# Patient Record
Sex: Female | Born: 1980 | Race: White | Hispanic: No | Marital: Single | Smoking: Former smoker
Health system: Southern US, Community
[De-identification: ages and names within clinical notes are randomized; demographics above are authoritative.]

## PROBLEM LIST (undated history)

## (undated) DIAGNOSIS — G40909 Epilepsy, unspecified, not intractable, without status epilepticus: Secondary | ICD-10-CM

## (undated) HISTORY — PX: ANKLE SURGERY: SHX546

## (undated) HISTORY — PX: CHOLECYSTECTOMY: SHX55

---

## 2003-11-08 ENCOUNTER — Observation Stay: Payer: Self-pay

## 2003-11-09 ENCOUNTER — Ambulatory Visit: Payer: Self-pay

## 2003-11-16 ENCOUNTER — Emergency Department: Payer: Self-pay | Admitting: Internal Medicine

## 2003-12-15 ENCOUNTER — Observation Stay: Payer: Self-pay | Admitting: Obstetrics and Gynecology

## 2003-12-20 ENCOUNTER — Inpatient Hospital Stay: Payer: Self-pay

## 2007-09-21 ENCOUNTER — Emergency Department: Payer: Self-pay | Admitting: Emergency Medicine

## 2008-03-13 ENCOUNTER — Emergency Department: Payer: Self-pay | Admitting: Emergency Medicine

## 2010-01-12 ENCOUNTER — Emergency Department: Payer: Self-pay | Admitting: Emergency Medicine

## 2011-08-30 ENCOUNTER — Ambulatory Visit: Payer: Self-pay | Admitting: Medical

## 2011-10-15 ENCOUNTER — Ambulatory Visit: Payer: Self-pay | Admitting: Medical

## 2011-10-15 LAB — RAPID STREP-A WITH REFLX: Micro Text Report: NEGATIVE

## 2011-10-17 LAB — BETA STREP CULTURE(ARMC)

## 2011-12-20 ENCOUNTER — Ambulatory Visit: Payer: Self-pay | Admitting: Family Medicine

## 2014-03-30 ENCOUNTER — Emergency Department: Payer: Self-pay | Admitting: Emergency Medicine

## 2014-05-15 ENCOUNTER — Encounter: Payer: Self-pay | Admitting: Emergency Medicine

## 2014-05-15 ENCOUNTER — Emergency Department
Admission: EM | Admit: 2014-05-15 | Discharge: 2014-05-15 | Disposition: A | Discharge status: Home / Self Care | Payer: BC Managed Care – PPO | Attending: Emergency Medicine | Admitting: Emergency Medicine

## 2014-05-15 ENCOUNTER — Emergency Department: Payer: BC Managed Care – PPO

## 2014-05-15 DIAGNOSIS — T148 Other injury of unspecified body region: Secondary | ICD-10-CM | POA: Insufficient documentation

## 2014-05-15 DIAGNOSIS — Y92410 Unspecified street and highway as the place of occurrence of the external cause: Secondary | ICD-10-CM | POA: Insufficient documentation

## 2014-05-15 DIAGNOSIS — Y9389 Activity, other specified: Secondary | ICD-10-CM | POA: Diagnosis not present

## 2014-05-15 DIAGNOSIS — S8001XA Contusion of right knee, initial encounter: Secondary | ICD-10-CM | POA: Insufficient documentation

## 2014-05-15 DIAGNOSIS — Y998 Other external cause status: Secondary | ICD-10-CM | POA: Insufficient documentation

## 2014-05-15 DIAGNOSIS — S8991XA Unspecified injury of right lower leg, initial encounter: Secondary | ICD-10-CM | POA: Diagnosis present

## 2014-05-15 DIAGNOSIS — S8002XA Contusion of left knee, initial encounter: Secondary | ICD-10-CM | POA: Insufficient documentation

## 2014-05-15 DIAGNOSIS — S20219A Contusion of unspecified front wall of thorax, initial encounter: Secondary | ICD-10-CM | POA: Insufficient documentation

## 2014-05-15 DIAGNOSIS — T148XXA Other injury of unspecified body region, initial encounter: Secondary | ICD-10-CM

## 2014-05-15 HISTORY — DX: Epilepsy, unspecified, not intractable, without status epilepticus: G40.909

## 2014-05-15 MED ORDER — TRAMADOL HCL 50 MG PO TABS: 50.0000 mg | ORAL_TABLET | Freq: Four times a day (QID) | ORAL | Status: AC | PRN

## 2014-05-15 MED ORDER — KETOROLAC TROMETHAMINE 10 MG PO TABS
10.0000 mg | ORAL_TABLET | Freq: Once | ORAL | Status: AC
  Administered 2014-05-15: 10 mg via ORAL

## 2014-05-15 MED ORDER — IBUPROFEN 800 MG PO TABS: 800.0000 mg | ORAL_TABLET | Freq: Three times a day (TID) | ORAL | Status: DC | PRN

## 2014-05-15 MED ORDER — KETOROLAC TROMETHAMINE 10 MG PO TABS
ORAL_TABLET | ORAL | Status: AC
  Filled 2014-05-15: qty 1
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
## 2014-05-15 NOTE — Discharge Instructions (Signed)
Contusion °A contusion is a deep bruise. Contusions are the result of an injury that caused bleeding under the skin. The contusion may turn blue, purple, or yellow. Minor injuries will give you a painless contusion, but more severe contusions may stay painful and swollen for a few weeks.  °CAUSES  °A contusion is usually caused by a blow, trauma, or direct force to an area of the body. °SYMPTOMS  °· Swelling and redness of the injured area. °· Bruising of the injured area. °· Tenderness and soreness of the injured area. °· Pain. °DIAGNOSIS  °The diagnosis can be made by taking a history and physical exam. An X-ray, CT scan, or MRI may be needed to determine if there were any associated injuries, such as fractures. °TREATMENT  °Specific treatment will depend on what area of the body was injured. In general, the best treatment for a contusion is resting, icing, elevating, and applying cold compresses to the injured area. Over-the-counter medicines may also be recommended for pain control. Ask your caregiver what the best treatment is for your contusion. °HOME CARE INSTRUCTIONS  °· Put ice on the injured area. °· Put ice in a plastic bag. °· Place a towel between your skin and the bag. °· Leave the ice on for 15-20 minutes, 3-4 times a day, or as directed by your health care provider. °· Only take over-the-counter or prescription medicines for pain, discomfort, or fever as directed by your caregiver. Your caregiver may recommend avoiding anti-inflammatory medicines (aspirin, ibuprofen, and naproxen) for 48 hours because these medicines may increase bruising. °· Rest the injured area. °· If possible, elevate the injured area to reduce swelling. °SEEK IMMEDIATE MEDICAL CARE IF:  °· You have increased bruising or swelling. °· You have pain that is getting worse. °· Your swelling or pain is not relieved with medicines. °MAKE SURE YOU:  °· Understand these instructions. °· Will watch your condition. °· Will get help right  away if you are not doing well or get worse. °Document Released: 10/08/2004 Document Revised: 01/03/2013 Document Reviewed: 11/03/2010 °[employer]® Patient Information ©2015 [employer]. This information is not intended to replace advice given to you by your health care provider. Make sure you discuss any questions you have with your health care provider. ° °Abrasion °An abrasion is a cut or scrape of the skin. Abrasions do not extend through all layers of the skin and most heal within 10 days. It is important to care for your abrasion properly to prevent infection. °CAUSES  °Most abrasions are caused by falling on, or gliding across, the ground or other surface. When your skin rubs on something, the outer and inner layer of skin rubs off, causing an abrasion. °DIAGNOSIS  °Your caregiver will be able to diagnose an abrasion during a physical exam.  °TREATMENT  °Your treatment depends on how large and deep the abrasion is. Generally, your abrasion will be cleaned with water and a mild soap to remove any dirt or debris. An antibiotic ointment may be put over the abrasion to prevent an infection. A bandage (dressing) may be wrapped around the abrasion to keep it from getting dirty.  °You may need a tetanus shot if: °· You cannot remember when you had your last tetanus shot. °· You have never had a tetanus shot. °· The injury broke your skin. °If you get a tetanus shot, your arm may swell, get red, and feel warm to the touch. This is common and not a problem. If you need a   tetanus shot and you choose not to have one, there is a rare chance of getting tetanus. Sickness from tetanus can be serious.  °HOME CARE INSTRUCTIONS  °· If a dressing was applied, change it at least once a day or as directed by your caregiver. If the bandage sticks, soak it off with warm water.   °· Wash the area with water and a mild soap to remove all the ointment 2 times a day. Rinse off the soap and pat the area dry with a clean towel.    °· Reapply any ointment as directed by your caregiver. This will help prevent infection and keep the bandage from sticking. Use gauze over the wound and under the dressing to help keep the bandage from sticking.   °· Change your dressing right away if it becomes wet or dirty.   °· Only take over-the-counter or prescription medicines for pain, discomfort, or fever as directed by your caregiver.   °· Follow up with your caregiver within 24-48 hours for a wound check, or as directed. If you were not given a wound-check appointment, look closely at your abrasion for redness, swelling, or pus. These are signs of infection. °SEEK IMMEDIATE MEDICAL CARE IF:  °· You have increasing pain in the wound.   °· You have redness, swelling, or tenderness around the wound.   °· You have pus coming from the wound.   °· You have a fever or persistent symptoms for more than 2-3 days. °· You have a fever and your symptoms suddenly get worse. °· You have a bad smell coming from the wound or dressing.   °MAKE SURE YOU:  °· Understand these instructions. °· Will watch your condition. °· Will get help right away if you are not doing well or get worse. °Document Released: 10/08/2004 Document Revised: 12/16/2011 Document Reviewed: 12/02/2010 °ExitCare® Patient Information ©2015 [employer]. This information is not intended to replace advice given to you by your health care provider. Make sure you discuss any questions you have with your health care provider. ° °
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
## 2015-02-23 ENCOUNTER — Ambulatory Visit
Admission: EM | Admit: 2015-02-23 | Discharge: 2015-02-23 | Disposition: A | Discharge status: Home / Self Care | Payer: BC Managed Care – PPO

## 2015-02-26 ENCOUNTER — Ambulatory Visit
Admission: EM | Admit: 2015-02-26 | Discharge: 2015-02-26 | Disposition: A | Discharge status: Home / Self Care | Payer: BC Managed Care – PPO | Attending: Family Medicine | Admitting: Family Medicine

## 2015-02-26 DIAGNOSIS — J0100 Acute maxillary sinusitis, unspecified: Secondary | ICD-10-CM | POA: Diagnosis not present

## 2015-02-26 DIAGNOSIS — R05 Cough: Secondary | ICD-10-CM

## 2015-02-26 DIAGNOSIS — R059 Cough, unspecified: Secondary | ICD-10-CM

## 2015-02-26 LAB — RAPID INFLUENZA A&B ANTIGENS: Influenza B (ARMC): NOT DETECTED

## 2015-02-26 LAB — RAPID INFLUENZA A&B ANTIGENS (ARMC ONLY): INFLUENZA A (ARMC): NOT DETECTED

## 2015-02-26 LAB — RAPID STREP SCREEN (MED CTR MEBANE ONLY): Streptococcus, Group A Screen (Direct): NEGATIVE

## 2015-02-26 MED ORDER — GUAIFENESIN-CODEINE 100-10 MG/5ML PO SOLN: 10.0000 mL | Freq: Four times a day (QID) | ORAL | Status: DC | PRN

## 2015-02-26 MED ORDER — AMOXICILLIN 875 MG PO TABS: 875.0000 mg | ORAL_TABLET | Freq: Two times a day (BID) | ORAL | Status: DC

## 2015-02-26 NOTE — ED Provider Notes (Signed)
CSN: [id number]     Arrival date & time 02/26/15  4098 History   First MD Initiated Contact with Patient 02/26/15 1037     Chief Complaint  Patient presents with  . URI   (Consider location/radiation/quality/duration/timing/severity/associated sxs/prior Treatment) Patient is a 35 y.o. female presenting with URI. The history is provided by the patient.  URI Presenting symptoms: congestion, cough, facial pain, fatigue, fever and sore throat   Severity:  Moderate Onset quality:  Sudden Duration:  3 weeks Timing:  Constant Progression:  Worsening Chronicity:  New Relieved by:  Nothing Ineffective treatments:  OTC medications Associated symptoms: headaches and sinus pain   Associated symptoms: no wheezing   Risk factors: sick contacts   Risk factors: not elderly, no chronic cardiac disease, no chronic kidney disease, no chronic respiratory disease, no diabetes mellitus, no immunosuppression and no recent travel     Past Medical History  Diagnosis Date  . EP (epilepsy) (HCC)    History reviewed. No pertinent past surgical history. Family History  Problem Relation Age of Onset  . Cancer Mother   . Heart attack Father    Social History  Substance Use Topics  . Smoking status: Never Smoker   . Smokeless tobacco: None  . Alcohol Use: Yes   OB History    No data available     Review of Systems  Constitutional: Positive for fever and fatigue.  HENT: Positive for congestion and sore throat.   Respiratory: Positive for cough. Negative for wheezing.   Neurological: Positive for headaches.    Allergies  Review of patient's allergies indicates no known allergies.  Home Medications   Prior to Admission medications   Medication Sig Start Date End Date Taking? Authorizing Provider  escitalopram (LEXAPRO) 20 MG tablet Take 20 mg by mouth daily.   Yes Historical Provider, MD  lamoTRIgine (LAMICTAL) 200 MG tablet Take 200 mg by mouth 2 (two) times daily.   Yes Historical  Provider, MD  levETIRAcetam (KEPPRA) 1000 MG tablet Take 1,500 mg by mouth 2 (two) times daily.   Yes Historical Provider, MD  topiramate (TOPAMAX) 100 MG tablet Take 100 mg by mouth 2 (two) times daily.   Yes Historical Provider, MD  amoxicillin (AMOXIL) 875 MG tablet Take 1 tablet (875 mg total) by mouth 2 (two) times daily. 02/26/15   Payton Mccallum, MD  guaiFENesin-codeine 100-10 MG/5ML syrup Take 10 mLs by mouth every 6 (six) hours as needed for cough. 02/26/15   Payton Mccallum, MD  ibuprofen (ADVIL,MOTRIN) 800 MG tablet Take 1 tablet (800 mg total) by mouth every 8 (eight) hours as needed. 05/15/14   III Kristine Garbe Ruffian, PA-C  traMADol (ULTRAM) 50 MG tablet Take 1 tablet (50 mg total) by mouth every 6 (six) hours as needed. 05/15/14 05/15/15  III Rosalyn Gess, PA-C   Meds Ordered and Administered this Visit  Medications - No data to display  BP 106/64 mmHg  Pulse 80  Temp(Src) 99 F (37.2 C) (Oral)  Resp 18  Ht  (1.702 m)  Wt 208 lb (94.348 kg)  BMI 32.57 kg/m2  SpO2 100% No data found.   Physical Exam  Constitutional: She appears well-developed and well-nourished. No distress.  HENT:  Head: Normocephalic and atraumatic.  Right Ear: Tympanic membrane, external ear and ear canal normal.  Left Ear: Tympanic membrane, external ear and ear canal normal.  Nose: Mucosal edema and rhinorrhea present. No nose lacerations, sinus tenderness, nasal deformity, septal deviation or nasal septal hematoma. No  epistaxis.  No foreign bodies. Right sinus exhibits maxillary sinus tenderness and frontal sinus tenderness. Left sinus exhibits maxillary sinus tenderness and frontal sinus tenderness.  Mouth/Throat: Uvula is midline, oropharynx is clear and moist and mucous membranes are normal. No oropharyngeal exudate.  Eyes: Conjunctivae and EOM are normal. Pupils are equal, round, and reactive to light. Right eye exhibits no discharge. Left eye exhibits no discharge. No scleral icterus.  Neck:  Normal range of motion. Neck supple. No thyromegaly present.  Cardiovascular: Normal rate, regular rhythm and normal heart sounds.   Pulmonary/Chest: Effort normal and breath sounds normal. No respiratory distress. She has no wheezes. She has no rales.  Lymphadenopathy:    She has no cervical adenopathy.  Skin: She is not diaphoretic.  Nursing note and vitals reviewed.   ED Course  Procedures (including critical care time)  Labs Review Labs Reviewed  RAPID INFLUENZA A&B ANTIGENS ([facility] ONLY)  RAPID STREP SCREEN (NOT AT [facility])  CULTURE, GROUP A STREP [facility])    Imaging Review No results found.   Visual Acuity Review  Right Eye Distance:   Left Eye Distance:   Bilateral Distance:    Right Eye Near:   Left Eye Near:    Bilateral Near:         MDM   1. Acute maxillary sinusitis, recurrence not specified   2. Cough    Discharge Medication List as of 02/26/2015 10:49 AM    START taking these medications   Details  amoxicillin (AMOXIL) 875 MG tablet Take 1 tablet (875 mg total) by mouth 2 (two) times daily., Starting 02/26/2015, Until Discontinued, Normal    guaiFENesin-codeine 100-10 MG/5ML syrup Take 10 mLs by mouth every 6 (six) hours as needed for cough., Starting 02/26/2015, Until Discontinued, Print       1. diagnosis reviewed with patient 2. rx as per orders above; reviewed possible side effects, interactions, risks and benefits  3. Recommend supportive treatment with otc analgesics prn 4. Follow-up prn if symptoms worsen or don't improve    Payton Mccallum, MD 02/26/15 1122

## 2015-02-26 NOTE — ED Notes (Signed)
Patient c/o left ear pain, cough, sinus pressure, and sore throat which started two Sundays ago.  Denies fever/c/n/v or chest pain.

## 2015-02-28 LAB — CULTURE, GROUP A STREP (THRC)

## 2015-11-11 ENCOUNTER — Other Ambulatory Visit: Payer: Self-pay | Admitting: Obstetrics and Gynecology

## 2015-11-11 DIAGNOSIS — Z803 Family history of malignant neoplasm of breast: Secondary | ICD-10-CM

## 2015-11-11 DIAGNOSIS — Z9189 Other specified personal risk factors, not elsewhere classified: Secondary | ICD-10-CM

## 2015-11-11 DIAGNOSIS — Z1239 Encounter for other screening for malignant neoplasm of breast: Secondary | ICD-10-CM

## 2015-11-18 ENCOUNTER — Ambulatory Visit: Admission: RE | Admit: 2015-11-18 | Payer: No Typology Code available for payment source | Source: Ambulatory Visit

## 2015-11-20 ENCOUNTER — Ambulatory Visit: Payer: No Typology Code available for payment source

## 2015-11-26 ENCOUNTER — Ambulatory Visit
Admission: RE | Admit: 2015-11-26 | Discharge: 2015-11-26 | Disposition: A | Discharge status: Home / Self Care | Payer: BLUE CROSS/BLUE SHIELD | Source: Ambulatory Visit | Attending: Obstetrics and Gynecology | Admitting: Obstetrics and Gynecology

## 2015-11-26 ENCOUNTER — Encounter (INDEPENDENT_AMBULATORY_CARE_PROVIDER_SITE_OTHER): Payer: Self-pay

## 2015-11-26 ENCOUNTER — Other Ambulatory Visit: Payer: Self-pay | Admitting: Obstetrics and Gynecology

## 2015-11-26 DIAGNOSIS — Z9189 Other specified personal risk factors, not elsewhere classified: Secondary | ICD-10-CM

## 2015-11-26 DIAGNOSIS — Z1231 Encounter for screening mammogram for malignant neoplasm of breast: Secondary | ICD-10-CM | POA: Insufficient documentation

## 2015-11-26 DIAGNOSIS — Z803 Family history of malignant neoplasm of breast: Secondary | ICD-10-CM

## 2015-11-26 DIAGNOSIS — Z1239 Encounter for other screening for malignant neoplasm of breast: Secondary | ICD-10-CM

## 2015-11-29 ENCOUNTER — Other Ambulatory Visit: Payer: Self-pay | Admitting: Obstetrics and Gynecology

## 2015-11-29 DIAGNOSIS — R921 Mammographic calcification found on diagnostic imaging of breast: Secondary | ICD-10-CM

## 2015-12-16 ENCOUNTER — Ambulatory Visit: Payer: No Typology Code available for payment source

## 2015-12-18 ENCOUNTER — Other Ambulatory Visit: Payer: Self-pay | Admitting: Obstetrics and Gynecology

## 2015-12-18 ENCOUNTER — Ambulatory Visit
Admission: RE | Admit: 2015-12-18 | Discharge: 2015-12-18 | Disposition: A | Discharge status: Home / Self Care | Payer: BLUE CROSS/BLUE SHIELD | Source: Ambulatory Visit | Attending: Obstetrics and Gynecology | Admitting: Obstetrics and Gynecology

## 2015-12-18 DIAGNOSIS — R921 Mammographic calcification found on diagnostic imaging of breast: Secondary | ICD-10-CM | POA: Insufficient documentation

## 2015-12-25 ENCOUNTER — Emergency Department
Admission: EM | Admit: 2015-12-25 | Discharge: 2015-12-25 | Disposition: A | Discharge status: Home / Self Care | Payer: BLUE CROSS/BLUE SHIELD | Attending: Emergency Medicine | Admitting: Emergency Medicine

## 2015-12-25 DIAGNOSIS — Z79899 Other long term (current) drug therapy: Secondary | ICD-10-CM | POA: Diagnosis not present

## 2015-12-25 DIAGNOSIS — R1013 Epigastric pain: Secondary | ICD-10-CM | POA: Insufficient documentation

## 2015-12-25 LAB — URINALYSIS, COMPLETE (UACMP) WITH MICROSCOPIC
BILIRUBIN URINE: NEGATIVE
GLUCOSE, UA: NEGATIVE mg/dL
Hgb urine dipstick: NEGATIVE
KETONES UR: 5 mg/dL — AB
Nitrite: NEGATIVE
PH: 5 (ref 5.0–8.0)
PROTEIN: NEGATIVE mg/dL
Specific Gravity, Urine: 1.017 (ref 1.005–1.030)

## 2015-12-25 LAB — CBC
HCT: 39 % (ref 35.0–47.0)
HEMOGLOBIN: 13.4 g/dL (ref 12.0–16.0)
MCH: 30.6 pg (ref 26.0–34.0)
MCHC: 34.5 g/dL (ref 32.0–36.0)
MCV: 88.8 fL (ref 80.0–100.0)
PLATELETS: 295 10*3/uL (ref 150–440)
RBC: 4.39 MIL/uL (ref 3.80–5.20)
RDW: 13.9 % (ref 11.5–14.5)
WBC: 15.7 10*3/uL — AB (ref 3.6–11.0)

## 2015-12-25 LAB — COMPREHENSIVE METABOLIC PANEL
ALT: 23 U/L (ref 14–54)
ANION GAP: 7 (ref 5–15)
AST: 28 U/L (ref 15–41)
Albumin: 4.2 g/dL (ref 3.5–5.0)
Alkaline Phosphatase: 116 U/L (ref 38–126)
BUN: 11 mg/dL (ref 6–20)
CHLORIDE: 108 mmol/L (ref 101–111)
CO2: 25 mmol/L (ref 22–32)
CREATININE: 0.94 mg/dL (ref 0.44–1.00)
Calcium: 9.4 mg/dL (ref 8.9–10.3)
Glucose, Bld: 126 mg/dL — ABNORMAL HIGH (ref 65–99)
Potassium: 3.8 mmol/L (ref 3.5–5.1)
SODIUM: 140 mmol/L (ref 135–145)
Total Bilirubin: 0.8 mg/dL (ref 0.3–1.2)
Total Protein: 8.4 g/dL — ABNORMAL HIGH (ref 6.5–8.1)

## 2015-12-25 LAB — LIPASE, BLOOD: LIPASE: 45 U/L (ref 11–51)

## 2015-12-25 MED ORDER — FAMOTIDINE 20 MG PO TABS: 20.0000 mg | ORAL_TABLET | Freq: Two times a day (BID) | ORAL | 0 refills | Status: DC

## 2015-12-25 MED ORDER — ALUMINUM-MAGNESIUM-SIMETHICONE 200-200-20 MG/5ML PO SUSP: 30.0000 mL | Freq: Three times a day (TID) | ORAL | 0 refills | Status: DC

## 2015-12-25 MED ORDER — GI COCKTAIL ~~LOC~~
30.0000 mL | ORAL | Status: AC
  Administered 2015-12-25: 30 mL via ORAL
  Filled 2015-12-25: qty 30

## 2015-12-25 MED ORDER — FAMOTIDINE 20 MG PO TABS
40.0000 mg | ORAL_TABLET | Freq: Once | ORAL | Status: AC
  Administered 2015-12-25: 40 mg via ORAL
  Filled 2015-12-25: qty 2

## 2015-12-25 NOTE — ED Triage Notes (Signed)
Pt reports generalized abd pain since 1130 this morning, denies N/V/D

## 2015-12-25 NOTE — ED Notes (Signed)
A/o. Deep flank pain 7/10. abd not painful on palpation. Denies NVD. Clear BS, + bowel sounds.

## 2015-12-25 NOTE — ED Provider Notes (Signed)
[facility] Emergency Department Provider Note  ____________________________________________  Time seen: Approximately 4:59 PM  I have reviewed the triage vital signs and the nursing notes.   HISTORY  Chief Complaint Abdominal Pain    HPI Elizabeth Pollard is a 35 y.o. female who complains of generalized abdominal pain, worse in the epigastrium since about noon today. Waxing and waning but constant, nonradiating. No nausea vomiting or diarrhea. Last bowel movement yesterday. He notes that when she was able to burp or pass some gas she felt better. No fever or chills. Eating and drinking normally. No chest pain or shortness of breath. No dysuria frequency urgency vaginal bleeding or discharge.     Past Medical History:  Diagnosis Date  . EP (epilepsy) (HCC)      There are no active problems to display for this patient.    History reviewed. No pertinent surgical history.   Prior to Admission medications   Medication Sig Start Date End Date Taking? Authorizing Provider  aluminum-magnesium hydroxide-simethicone (MAALOX) 200-200-20 MG/5ML SUSP Take 30 mLs by mouth 4 (four) times daily -  before meals and at bedtime. 12/25/15   Sharman CheekPhillip Gwendelyn Lanting, MD  amoxicillin (AMOXIL) 875 MG tablet Take 1 tablet (875 mg total) by mouth 2 (two) times daily. 02/26/15   Payton Mccallumrlando Conty, MD  escitalopram (LEXAPRO) 20 MG tablet Take 20 mg by mouth daily.    Historical Provider, MD  famotidine (PEPCID) 20 MG tablet Take 1 tablet (20 mg total) by mouth 2 (two) times daily. 12/25/15   Sharman CheekPhillip Aubryana Vittorio, MD  guaiFENesin-codeine 100-10 MG/5ML syrup Take 10 mLs by mouth every 6 (six) hours as needed for cough. 02/26/15   Payton Mccallumrlando Conty, MD  ibuprofen (ADVIL,MOTRIN) 800 MG tablet Take 1 tablet (800 mg total) by mouth every 8 (eight) hours as needed. 05/15/14   III Kristine GarbeWilliam C Ruffian, PA-C  lamoTRIgine (LAMICTAL) 200 MG tablet Take 200 mg by mouth 2 (two) times daily.    Historical Provider, MD   levETIRAcetam (KEPPRA) 1000 MG tablet Take 1,500 mg by mouth 2 (two) times daily.    Historical Provider, MD  topiramate (TOPAMAX) 100 MG tablet Take 100 mg by mouth 2 (two) times daily.    Historical Provider, MD     Allergies Patient has no known allergies.   Family History  Problem Relation Age of Onset  . Cancer Mother   . Breast cancer Mother [id number]    x3  . Heart attack Father   . Breast cancer Paternal Aunt 40  . Breast cancer Paternal Aunt     2 pat great aunts    Social History Social History  Substance Use Topics  . Smoking status: Never Smoker  . Smokeless tobacco: Never Used  . Alcohol use Yes    Review of Systems  Constitutional:   No fever or chills.  ENT:   No sore throat. No rhinorrhea. Cardiovascular:   No chest pain. Respiratory:   No dyspnea or cough. Gastrointestinal:   Positive as above for abdominal pain without vomiting and diarrhea.  Genitourinary:   Negative for dysuria or difficulty urinating. Musculoskeletal:   Negative for focal pain or swelling Neurological:   Negative for headaches 10-point ROS otherwise negative.  ____________________________________________   PHYSICAL EXAM:  VITAL SIGNS: ED Triage Vitals  Enc Vitals Group     BP 12/25/15 1304 109/63     Pulse Rate 12/25/15 1304 78     Resp 12/25/15 1304 16     Temp 12/25/15 1304 97.4  F (36.3 C)     Temp Source 12/25/15 1304 Oral     SpO2 12/25/15 1304 99 %     Weight 12/25/15 1303 187 lb (84.8 kg)     Height 12/25/15 1303 5\' 7"  (1.702 m)     Head Circumference --      Peak Flow --      Pain Score 12/25/15 1304 5     Pain Loc --      Pain Edu? --      Excl. in GC? --     Vital signs reviewed, nursing assessments reviewed.   Constitutional:   Alert and oriented. Well appearing and in no distress. Eyes:   No scleral icterus. No conjunctival pallor. PERRL. EOMI.  No nystagmus. ENT   Head:   Normocephalic and atraumatic.   Nose:   No congestion/rhinnorhea. No  septal hematoma   Mouth/Throat:   MMM, no pharyngeal erythema. No peritonsillar mass.    Neck:   No stridor. No SubQ emphysema. No meningismus. Hematological/Lymphatic/Immunilogical:   No cervical lymphadenopathy. Cardiovascular:   RRR. Symmetric bilateral radial and DP pulses.  No murmurs.  Respiratory:   Normal respiratory effort without tachypnea nor retractions. Breath sounds are clear and equal bilaterally. No wheezes/rales/rhonchi. Gastrointestinal:   Soft and nontender. Non distended. There is no CVA tenderness.  No rebound, rigidity, or guarding. Genitourinary:   deferred Musculoskeletal:   Nontender with normal range of motion in all extremities. No joint effusions.  No lower extremity tenderness.  No edema. Neurologic:   Normal speech and language.  CN 2-10 normal. Motor grossly intact. No gross focal neurologic deficits are appreciated.  Skin:    Skin is warm, dry and intact. No rash noted.  No petechiae, purpura, or bullae.  ____________________________________________    LABS (pertinent positives/negatives) (all labs ordered are listed, but only abnormal results are displayed) Labs Reviewed  COMPREHENSIVE METABOLIC PANEL - Abnormal; Notable for the following:       Result Value   Glucose, Bld 126 (*)    Total Protein 8.4 (*)    All other components within normal limits  CBC - Abnormal; Notable for the following:    WBC 15.7 (*)    All other components within normal limits  URINALYSIS, COMPLETE (UACMP) WITH MICROSCOPIC - Abnormal; Notable for the following:    Color, Urine YELLOW (*)    APPearance CLEAR (*)    Ketones, ur 5 (*)    Leukocytes, UA TRACE (*)    Bacteria, UA RARE (*)    Squamous Epithelial / LPF 0-5 (*)    All other components within normal limits  LIPASE, BLOOD   ____________________________________________   EKG    ____________________________________________     RADIOLOGY    ____________________________________________   PROCEDURES Procedures  ____________________________________________   INITIAL IMPRESSION / ASSESSMENT AND PLAN / ED COURSE  Pertinent labs & imaging results that were available during my care of the patient were reviewed by me and considered in my medical decision making (see chart for details).  Patient presents with nonspecific abdominal pain without any focal symptoms. Exam is benign and vital signs are normal. Afebrile. She does have a leukocytosis of 15,000, but there are no signs or symptoms to point to any specific cause of this. It may be due simply to her discomfort and the stress this causes her. Her presentation is not consistent with sepsis or surgical abdomen.Considering the patient's symptoms, medical history, and physical examination today, I have low suspicion for cholecystitis or  biliary pathology, pancreatitis, perforation or bowel obstruction, hernia, intra-abdominal abscess, AAA or dissection, volvulus or intussusception, mesenteric ischemia, or appendicitis. No urinary tract infection or pyelonephritis. Low suspicion for STI.  I advised a trial of antacids and follow up with primary care.       Clinical Course    ____________________________________________   FINAL CLINICAL IMPRESSION(S) / ED DIAGNOSES  Final diagnoses:  Epigastric pain      New Prescriptions   ALUMINUM-MAGNESIUM HYDROXIDE-SIMETHICONE (MAALOX) 200-200-20 MG/5ML SUSP    Take 30 mLs by mouth 4 (four) times daily -  before meals and at bedtime.   FAMOTIDINE (PEPCID) 20 MG TABLET    Take 1 tablet (20 mg total) by mouth 2 (two) times daily.     Portions of this note were generated with dragon dictation software. Dictation errors may occur despite best attempts at proofreading.    Sharman Cheek, MD 12/25/15 [phone]

## 2016-01-01 ENCOUNTER — Encounter: Payer: Self-pay | Admitting: *Deleted

## 2016-01-01 ENCOUNTER — Ambulatory Visit
Admission: EM | Admit: 2016-01-01 | Discharge: 2016-01-01 | Disposition: A | Discharge status: Home / Self Care | Payer: BLUE CROSS/BLUE SHIELD | Attending: Emergency Medicine | Admitting: Emergency Medicine

## 2016-01-01 DIAGNOSIS — R110 Nausea: Secondary | ICD-10-CM | POA: Diagnosis not present

## 2016-01-01 DIAGNOSIS — R1012 Left upper quadrant pain: Secondary | ICD-10-CM | POA: Diagnosis not present

## 2016-01-01 NOTE — ED Triage Notes (Signed)
Patient started having LUQ pain 1 week ago. Addition symptom of nausea is also present.

## 2016-01-01 NOTE — ED Provider Notes (Signed)
HPI  SUBJECTIVE:  Elizabeth Pollard is a 35 y.o. female who presents with daily nausea for the past week. She reports diffuse abdominal pain 1 week ago, which resolved. She states that she has not had any abdominal pain up until today when she had started to have intermittent, minutes long, left upper quadrant pain that radiates around to her back.  Does not radiate up to arm or to her neck. She reports continued nausea, but states it is not worse today. No vomiting, fevers, coughing, wheezing, chest pain, shortness of breath. No urinary or vaginal complaints. No flu symptoms, sore throat, new or different headaches, anorexia. There is no exertional positional component to this pain. She tried Tums without improvement of symptoms. There are no aggravating symptoms for her abdominal pain, but she says that eating and drinking makes her more nausea's. Her abdominal pain is not associated with eating, drinking, movement, urination or defecation. She has past medical history of epilepsy states that she is compliant with her antiseizure medicines. She denies any change in her medication recently. His past mental history of GERD. No history of peptic ulcer disease, smoking, alcohol, excess NSAID use. No MI, coronary disease, hypertension, diabetes, atrial fibrillation, mesenteric ischemia, gallbladder disease, pancreatitis, Crohn's, ulcerative colitis, hypercoagulability, PE, DVT. She has a NuvaRing for birth control. She has a history of UTIs, no history of the pyelonephritis, nephrolithiasis. No history of abdominal surgeries, H. pylori infections. LMP: 11/17. She states that she is not pregnant and states that we do not need to check. States that they checked at the ER last week, and states that she has not had intercourse since. PMD: [facility]. She has an appointment with them on 12/28.  She was seen in the [facility] ED last week for the same, and she had a CMP, CBC, UA, lipase done was remarkable only for  some leukocytosis. She was sent home with Pepcid and Maalox, which she states she did not fill.  Past Medical History:  Diagnosis Date  . EP (epilepsy) (HCC)     History reviewed. No pertinent surgical history.  Family History  Problem Relation Age of Onset  . Cancer Mother   . Breast cancer Mother 2851    x3  . Heart attack Father   . Breast cancer Paternal Aunt 40  . Breast cancer Paternal Aunt     2 pat great aunts    Social History  Substance Use Topics  . Smoking status: Never Smoker  . Smokeless tobacco: Never Used  . Alcohol use Yes    No current facility-administered medications for this encounter.   Current Outpatient Prescriptions:  .  escitalopram (LEXAPRO) 20 MG tablet, Take 20 mg by mouth daily., Disp: , Rfl:  .  lamoTRIgine (LAMICTAL) 200 MG tablet, Take 200 mg by mouth 2 (two) times daily., Disp: , Rfl:  .  levETIRAcetam (KEPPRA) 1000 MG tablet, Take 1,500 mg by mouth 2 (two) times daily., Disp: , Rfl:  .  topiramate (TOPAMAX) 100 MG tablet, Take 100 mg by mouth 2 (two) times daily., Disp: , Rfl:   No Known Allergies   ROS  As noted in HPI.   Physical Exam  BP 118/75 (BP Location: Left Arm)   Pulse 84   Temp 98.5 F (36.9 C) (Oral)   Resp 16   Ht 5\' 7"  (1.702 m)   Wt 187 lb (84.8 kg)   LMP 11/29/2015 Comment: has a nuva ring  SpO2 100%   BMI 29.29 kg/m  Constitutional: Well developed, well nourished, no acute distress Eyes:  EOMI, conjunctiva normal bilaterally HENT: Normocephalic, atraumatic,mucus membranes moist Respiratory: Normal inspiratory effort, lungs clear bilaterally. Cardiovascular: Normal rate regular rhythm no murmurs rubs or gallops GI: Soft, nontender, nondistended, active bowel sounds. No appreciable masses Back: No CVAT skin: No rash, skin intact Musculoskeletal: no deformities Neurologic: Alert & oriented x 3, no focal neuro deficits Psychiatric: Speech and behavior appropriate   ED Course   Medications - No data  to display  No orders of the defined types were placed in this encounter.   No results found for this or any previous visit (from the past 24 hour(s)). No results found.  ED Clinical Impression  Nausea  Left upper quadrant pain   ED Assessment/Plan  Reviewed previous records. As noted in history of present illness  Discussed with patient that we could repeat the workup that was done at [facility] and arrange an outpatient ultrasound, however, she has opted to wait for her primary care appointment. She does not wish to have a workup including EKG or H. pylori done today. Offered prescription of Zofran, but she declined this as well. She is currently asymptomatic she is not having abdominal pain and her belly is soft, nontender, nondistended. no evidence of surgical abdomen. We'll have her follow-up with her primary care provider as scheduled on 12/28. She may return here at any time for workup and outpatient ultrasound if she changes her mind, or go to an emergency room of her choice if her symptoms return.   No orders of the defined types were placed in this encounter.   *This clinic note was created using Dragon dictation software. Therefore, there may be occasional mistakes despite careful proofreading.  ?   Domenick GongAshley Asley [place], MD 01/01/16 [phone]

## 2016-01-01 NOTE — Discharge Instructions (Signed)
Feel free to return here if you change your mind and want a workup done. I can arrange an outpatient ultrasound, however, I do not have it available at this facility today. I can also repeat the labs were done in the emergency department. Otherwise, follow-up with your primary care physician as director. Go to an ER of your choice if things get worse.

## 2016-11-17 ENCOUNTER — Ambulatory Visit: Payer: Self-pay | Admitting: Obstetrics and Gynecology

## 2017-07-13 ENCOUNTER — Other Ambulatory Visit: Payer: Self-pay

## 2017-07-13 ENCOUNTER — Ambulatory Visit
Admission: EM | Admit: 2017-07-13 | Discharge: 2017-07-13 | Disposition: A | Discharge status: Home / Self Care | Payer: BLUE CROSS/BLUE SHIELD | Attending: Family Medicine | Admitting: Family Medicine

## 2017-07-13 DIAGNOSIS — M546 Pain in thoracic spine: Secondary | ICD-10-CM | POA: Diagnosis not present

## 2017-07-13 DIAGNOSIS — R1010 Upper abdominal pain, unspecified: Secondary | ICD-10-CM

## 2017-07-13 MED ORDER — PANTOPRAZOLE SODIUM 40 MG PO TBEC: 40.0000 mg | DELAYED_RELEASE_TABLET | Freq: Every day | ORAL | 0 refills | Status: DC

## 2017-07-13 NOTE — Discharge Instructions (Signed)
Medication as prescribed.  If you worsen, please let us know or your primary.  If persists, I would recommend an US.  Take care  Dr. Adriana Simasook

## 2017-07-13 NOTE — ED Provider Notes (Signed)
[facility]    CSN: [id number] Arrival date & time: 07/13/17  1918  History   Chief Complaint Chief Complaint  Patient presents with  . Back Pain   HPI  37 year old female presents with abdominal pain and back pain.  Patient reports that she has had this in the recent past, earlier this year.  She states that she is currently having left-sided back pain and corresponding "side pain".  Patient states that she has been experiencing what she states is reflux.  She states that this started yesterday after fatty meal.  Has persisted.  She has taken Alka-Seltzer, Mylanta, Tums without resolution.  No current reports of abdominal pain.  No fevers or chills.  No known relieving factors.  No other associated symptoms.  No other complaints.  Past Medical History:  Diagnosis Date  . EP (epilepsy) [facility])    Past Surgical History:  Procedure Laterality Date  . ANKLE SURGERY      OB History   None    Home Medications    Prior to Admission medications   Medication Sig Start Date End Date Taking? Authorizing Provider  escitalopram (LEXAPRO) 20 MG tablet Take 20 mg by mouth daily.    [provider]  lamoTRIgine (LAMICTAL) 200 MG tablet Take 200 mg by mouth 2 (two) times daily.    [provider]  levETIRAcetam (KEPPRA) 1000 MG tablet Take 1,500 mg by mouth 2 (two) times daily.    [provider]  pantoprazole (PROTONIX) 40 MG tablet Take 1 tablet (40 mg total) by mouth daily. 07/13/17   Tommie Sams, DO    Family History Family History  Problem Relation Age of Onset  . Cancer Mother   . Breast cancer Mother 33       x3  . Heart attack Father   . Breast cancer Paternal Aunt 40  . Breast cancer Paternal Aunt        2 pat great aunts    Social History Social History   Tobacco Use  . Smoking status: Never Smoker  . Smokeless tobacco: Never Used  Substance Use Topics  . Alcohol use: Yes    Comment: social  . Drug use: No     Allergies     Patient has no known allergies.   Review of Systems Review of Systems  Constitutional: Negative.   Musculoskeletal: Positive for back pain.   Physical Exam Triage Vital Signs ED Triage Vitals  Enc Vitals Group     BP 07/13/17 1949 (!) 137/98     Pulse Rate 07/13/17 1949 83     Resp 07/13/17 1949 18     Temp 07/13/17 1949 98.2 F (36.8 C)     Temp Source 07/13/17 1949 Oral     SpO2 07/13/17 1949 99 %     Weight 07/13/17 1946 213 lb (96.6 kg)     Height 07/13/17 1946 5\' 7"  (1.702 m)     Head Circumference --      Peak Flow --      Pain Score 07/13/17 1946 6     Pain Loc --      Pain Edu? --      Excl. in GC? --    Updated Vital Signs BP (!) 137/98 (BP Location: Right Arm)   Pulse 83   Temp 98.2 F (36.8 C) (Oral)   Resp 18   Ht 5\' 7"  (1.702 m)   Wt 213 lb (96.6 kg)   LMP 06/22/2017  SpO2 99%   BMI 33.36 kg/m   Visual Acuity Right Eye Distance:   Left Eye Distance:   Bilateral Distance:    Right Eye Near:   Left Eye Near:    Bilateral Near:     Physical Exam  Constitutional: She appears well-developed. No distress.  HENT:  Head: Normocephalic and atraumatic.  Eyes: Conjunctivae are normal. No scleral icterus.  Cardiovascular: Normal rate and regular rhythm.  Pulmonary/Chest: Effort normal and breath sounds normal. She has no wheezes. She has no rales.  Abdominal: Soft.  Mild right upper quadrant tenderness. No apparent left upper quadrant tenderness.  Patient did have some tenderness in the left lower quadrant as well.  Musculoskeletal:  Left midthoracic spine without tenderness to palpation.  Nursing note and vitals reviewed.  UC Treatments / Results  Labs (all labs ordered are listed, but only abnormal results are displayed) Labs Reviewed - No data to display  EKG None  Radiology No results found.  Procedures Procedures (including critical care time)  Medications Ordered in UC Medications - No data to display  Initial Impression /  Assessment and Plan / UC Course  I have reviewed the triage vital signs and the nursing notes.  Pertinent labs & imaging results that were available during my care of the patient were reviewed by me and considered in my medical decision making (see chart for details).    37 year old female with vague abdominal pain and back pain.  I have reviewed the notes from her primary care physician as well as the ER.  This appears to be gastroesophageal reflux.  Placing on Protonix.  Advised that if she fails to improve or worsens, she should have further evaluation with ultrasound.  Final Clinical Impressions(s) / UC Diagnoses   Final diagnoses:  Upper abdominal pain  Acute left-sided thoracic back pain     Discharge Instructions     Medication as prescribed.  If you worsen, please let us know or your primary.  If persists, I would recommend an US.  Take care  Dr. Adriana Simasook    ED Prescriptions    Medication Sig Dispense Auth. Provider   pantoprazole (PROTONIX) 40 MG tablet Take 1 tablet (40 mg total) by mouth daily. 90 tablet Tommie Samsook, Atlantis Delong G, DO     Controlled Substance Prescriptions [place] Controlled Substance Registry consulted? Not Applicable   Tommie SamsCook, Stephan Nelis G, DO 07/13/17 2039

## 2017-07-13 NOTE — ED Triage Notes (Signed)
Pt states "this is the 3rd time I've been seen for this." Has been diagnosed with Acid Reflux. Pt with epigastric pain which goes "around me like a band." Has been to her PCP and to ER for same. This episode started yesterday

## 2018-10-05 ENCOUNTER — Ambulatory Visit: Payer: Self-pay | Admitting: Maternal Newborn

## 2018-10-20 ENCOUNTER — Other Ambulatory Visit: Payer: Self-pay | Admitting: Family Medicine

## 2018-10-20 ENCOUNTER — Other Ambulatory Visit: Payer: Self-pay | Admitting: Obstetrics and Gynecology

## 2018-10-20 DIAGNOSIS — Z1231 Encounter for screening mammogram for malignant neoplasm of breast: Secondary | ICD-10-CM

## 2018-10-21 ENCOUNTER — Other Ambulatory Visit: Payer: Self-pay

## 2018-10-21 ENCOUNTER — Ambulatory Visit
Admission: EM | Admit: 2018-10-21 | Discharge: 2018-10-21 | Disposition: A | Discharge status: Home / Self Care | Payer: BLUE CROSS/BLUE SHIELD | Attending: Emergency Medicine | Admitting: Emergency Medicine

## 2018-10-21 DIAGNOSIS — K529 Noninfective gastroenteritis and colitis, unspecified: Secondary | ICD-10-CM

## 2018-10-21 MED ORDER — KETOROLAC TROMETHAMINE 60 MG/2ML IM SOLN
60.0000 mg | Freq: Once | INTRAMUSCULAR | Status: AC
  Administered 2018-10-21: 60 mg via INTRAMUSCULAR

## 2018-10-21 MED ORDER — ONDANSETRON HCL 4 MG PO TABS: 4.0000 mg | ORAL_TABLET | Freq: Three times a day (TID) | ORAL | 0 refills | Status: DC | PRN

## 2018-10-21 MED ORDER — ONDANSETRON 8 MG PO TBDP
8.0000 mg | ORAL_TABLET | Freq: Once | ORAL | Status: AC
  Administered 2018-10-21: 17:00:00 8 mg via ORAL

## 2018-10-21 NOTE — ED Triage Notes (Signed)
Patient states that she has been having fatigue, weak, body aches, vomiting and diarrhea x Wednesday.

## 2018-10-21 NOTE — ED Provider Notes (Signed)
[facility]    CSN: [id number] Arrival date & time: 10/21/18  1547      History   Chief Complaint Chief Complaint  Patient presents with   Fatigue    HPI Elizabeth Pollard is a 38 y.o. female.   Elizabeth Pollard presents with complaints of nausea, vomiting and diarrhea which started two days ago. Hasn't worsened but hasn't improved. Has had 2 episodes of vomiting today and 2-3 loose stools today. No blood or black in either. No abdominal pain. No fevers. Feels weak and fatigued. Intermittent headache. Minimal current nausea. Has been drinking fluids today which have stayed down. Has eaten small amount. No known ill contacts. Works at an office at [employer]. No recent travel. States has had similar with flu like viral illnesses. Normal urination. Sometimes feels light headed.  States she will need covid testing in order to return to work.    ROS per HPI, negative if not otherwise mentioned.      Past Medical History:  Diagnosis Date   EP (epilepsy) ([place])     There are no active problems to display for this patient.   Past Surgical History:  Procedure Laterality Date   ANKLE SURGERY      OB History   No obstetric history on file.      Home Medications    Prior to Admission medications   Medication Sig Start Date End Date Taking? Authorizing Provider  escitalopram (LEXAPRO) 20 MG tablet Take 20 mg by mouth daily.   Yes [provider]  lamoTRIgine (LAMICTAL) 200 MG tablet Take 200 mg by mouth 2 (two) times daily.   Yes [provider]  levETIRAcetam (KEPPRA) 1000 MG tablet Take 1,500 mg by mouth 2 (two) times daily.   Yes [provider]  ondansetron (ZOFRAN) 4 MG tablet Take 1 tablet (4 mg total) by mouth every 8 (eight) hours as needed for nausea or vomiting. 10/21/18   Zigmund Gottron, NP  pantoprazole (PROTONIX) 40 MG tablet Take 1 tablet (40 mg total) by mouth daily. 07/13/17   Coral Spikes, DO     Family History Family History  Problem Relation Age of Onset   Cancer Mother    Breast cancer Mother 78       x3   Heart attack Father    Breast cancer Paternal Aunt 75   Breast cancer Paternal Aunt        2 pat great aunts    Social History Social History   Tobacco Use   Smoking status: Never Smoker   Smokeless tobacco: Never Used  Substance Use Topics   Alcohol use: Yes    Comment: social   Drug use: No     Allergies   Patient has no known allergies.   Review of Systems Review of Systems   Physical Exam Triage Vital Signs ED Triage Vitals  Enc Vitals Group     BP 10/21/18 1558 124/90     Pulse Rate 10/21/18 1558 (!) 103     Resp 10/21/18 1558 18     Temp 10/21/18 1558 98.7 F (37.1 C)     Temp Source 10/21/18 1558 Oral     SpO2 10/21/18 1558 98 %     Weight 10/21/18 1556 220 lb (99.8 kg)     Height 10/21/18 1556 5\' 7"  (1.702 m)     Head Circumference --      Peak Flow --      Pain Score  10/21/18 1556 5     Pain Loc --      Pain Edu? --      Excl. in GC? --    No data found.  Updated Vital Signs BP 124/90 (BP Location: Right Arm)    Pulse (!) 103    Temp 98.7 F (37.1 C) (Oral)    Resp 18    Ht 5\' 7"  (1.702 m)    Wt 220 lb (99.8 kg)    LMP 09/30/2018    SpO2 98%    BMI 34.46 kg/m    Physical Exam Constitutional:      General: She is not in acute distress.    Appearance: She is well-developed.  Cardiovascular:     Rate and Rhythm: Normal rate.  Pulmonary:     Effort: Pulmonary effort is normal.  Abdominal:     Palpations: Abdomen is soft.     Tenderness: There is no abdominal tenderness. There is no guarding or rebound.  Skin:    General: Skin is warm and dry.  Neurological:     Mental Status: She is alert and oriented to person, place, and time.      UC Treatments / Results  Labs (all labs ordered are listed, but only abnormal results are displayed) Labs Reviewed  NOVEL CORONAVIRUS, NAA (HOSP ORDER, SEND-OUT TO REF  LAB; TAT 18-24 HRS)    EKG   Radiology No results found.  Procedures Procedures (including critical care time)  Medications Ordered in UC Medications  ketorolac (TORADOL) injection 60 mg (has no administration in time range)  ondansetron (ZOFRAN-ODT) disintegrating tablet 8 mg (has no administration in time range)    Initial Impression / Assessment and Plan / UC Course  I have reviewed the triage vital signs and the nursing notes.  Pertinent labs & imaging results that were available during my care of the patient were reviewed by me and considered in my medical decision making (see chart for details).     Non toxic. Benign physical exam. Mild tachycardia on arrival. Tolerating po intake. Encouraged to increase. zofran provided. Rest, rehydrate. covid testing collected and pending. Will notify of any positive findings and if any changes to treatment are needed.  Return precautions provided. Patient verbalized understanding and agreeable to plan.    Final Clinical Impressions(s) / UC Diagnoses   Final diagnoses:  Gastroenteritis     Discharge Instructions     Small frequent sips of fluids- Pedialyte, Gatorade, water, broth- to maintain hydration.   Zofran every 8 hours as needed for nausea or vomiting.   Tylenol as needed for headache.  Bland diet as tolerated.  Self isolate until covid results are back and negative.  Will notify you of any positive findings. You may monitor your results on your MyChart online as well.    If symptoms worsen or do not improve in the next week to return to be seen or to follow up with your PCP.     ED Prescriptions    Medication Sig Dispense Auth. Provider   ondansetron (ZOFRAN) 4 MG tablet Take 1 tablet (4 mg total) by mouth every 8 (eight) hours as needed for nausea or vomiting. 10 tablet 10/02/2018, NP     PDMP not reviewed this encounter.   Georgetta Haber, NP 10/21/18 1625

## 2018-10-21 NOTE — Discharge Instructions (Signed)
Small frequent sips of fluids- Pedialyte, Gatorade, water, broth- to maintain hydration.   Zofran every 8 hours as needed for nausea or vomiting.   Tylenol as needed for headache.  Bland diet as tolerated.  Self isolate until covid results are back and negative.  Will notify you of any positive findings. You may monitor your results on your MyChart online as well.    If symptoms worsen or do not improve in the next week to return to be seen or to follow up with your PCP.

## 2018-10-23 LAB — NOVEL CORONAVIRUS, NAA (HOSP ORDER, SEND-OUT TO REF LAB; TAT 18-24 HRS): SARS-CoV-2, NAA: NOT DETECTED

## 2018-11-10 ENCOUNTER — Ambulatory Visit
Admission: RE | Admit: 2018-11-10 | Discharge: 2018-11-10 | Disposition: A | Discharge status: Home / Self Care | Payer: BC Managed Care – PPO | Source: Ambulatory Visit | Attending: Family Medicine | Admitting: Family Medicine

## 2018-11-10 ENCOUNTER — Other Ambulatory Visit: Payer: Self-pay

## 2018-11-10 DIAGNOSIS — Z1231 Encounter for screening mammogram for malignant neoplasm of breast: Secondary | ICD-10-CM | POA: Insufficient documentation

## 2018-12-11 ENCOUNTER — Encounter: Payer: Self-pay | Admitting: Emergency Medicine

## 2018-12-11 ENCOUNTER — Ambulatory Visit
Admission: EM | Admit: 2018-12-11 | Discharge: 2018-12-11 | Disposition: A | Discharge status: Home / Self Care | Payer: BC Managed Care – PPO | Attending: Emergency Medicine | Admitting: Emergency Medicine

## 2018-12-11 ENCOUNTER — Other Ambulatory Visit: Payer: Self-pay

## 2018-12-11 DIAGNOSIS — Z20822 Contact with and (suspected) exposure to covid-19: Secondary | ICD-10-CM

## 2018-12-11 DIAGNOSIS — Z20828 Contact with and (suspected) exposure to other viral communicable diseases: Secondary | ICD-10-CM

## 2018-12-11 NOTE — ED Provider Notes (Signed)
[facility] ____________________________________________  Time seen: Approximately 11:29 AM  I have reviewed the triage vital signs and the nursing notes.   HISTORY  Chief Complaint COVID Test   HPI Elizabeth Pollard is a 38 y.o. female presenting for COVID-19 testing.  Patient reports she feels well but was exposed to COVID-19.  States she was around her cousin on Thanksgiving he received a positive test yesterday.  Patient denies any cough, congestion, sore throat, fever, body aches, change in taste or smell, chest pain or shortness of breath.  States feeling well and needed to have COVID-19 testing.  Past Medical History:  Diagnosis Date  . EP (epilepsy) ([place])     There are no active problems to display for this patient.   Past Surgical History:  Procedure Laterality Date  . ANKLE SURGERY    . CHOLECYSTECTOMY       No current facility-administered medications for this encounter.   Current Outpatient Medications:  .  escitalopram (LEXAPRO) 20 MG tablet, Take 20 mg by mouth daily., Disp: , Rfl:  .  etonogestrel-ethinyl estradiol (NUVARING) 0.12-0.015 MG/24HR vaginal ring, Place 1 each vaginally every 28 (twenty-eight) days. Insert vaginally and leave in place for 3 consecutive weeks, then remove for 1 week., Disp: , Rfl:  .  lamoTRIgine (LAMICTAL) 200 MG tablet, Take 200 mg by mouth 2 (two) times daily., Disp: , Rfl:  .  levETIRAcetam (KEPPRA) 1000 MG tablet, Take 1,500 mg by mouth 2 (two) times daily., Disp: , Rfl:  .  ondansetron (ZOFRAN) 4 MG tablet, Take 1 tablet (4 mg total) by mouth every 8 (eight) hours as needed for nausea or vomiting., Disp: 10 tablet, Rfl: 0 .  pantoprazole (PROTONIX) 40 MG tablet, Take 1 tablet (40 mg total) by mouth daily., Disp: 90 tablet, Rfl: 0  Allergies Patient has no known allergies.  Family History  Problem Relation Age of Onset  . Cancer Mother   . Breast cancer Mother 51       x3  . Heart attack Father   . Breast  cancer Paternal Aunt 70  . Breast cancer Paternal Aunt        2 pat great aunts  . Breast cancer Maternal Aunt 65    Social History Social History   Tobacco Use  . Smoking status: Never Smoker  . Smokeless tobacco: Never Used  Substance Use Topics  . Alcohol use: Yes    Comment: social  . Drug use: No    Review of Systems Constitutional: No fever ENT: No sore throat. Cardiovascular: Denies chest pain. Respiratory: Denies shortness of breath. Gastrointestinal: No abdominal pain.  No nausea, no vomiting.  No diarrhea.  Genitourinary: Negative for dysuria. Musculoskeletal: Negative for back pain. Skin: Negative for rash.   ____________________________________________   PHYSICAL EXAM:  VITAL SIGNS: ED Triage Vitals  Enc Vitals Group     BP 12/11/18 1026 118/90     Pulse Rate 12/11/18 1026 79     Resp 12/11/18 1026 16     Temp 12/11/18 1026 98.4 F (36.9 C)     Temp Source 12/11/18 1026 Oral     SpO2 12/11/18 1026 99 %     Weight 12/11/18 1023 222 lb (100.7 kg)     Height 12/11/18 1023 5\' 7"  (1.702 m)     Head Circumference --      Peak Flow --      Pain Score 12/11/18 1023 8     Pain Loc --  Pain Edu? --      Excl. in GC? --     Constitutional: Alert and oriented. Well appearing and in no acute distress. Eyes: Conjunctivae are normal.  ENT      Head: Normocephalic and atraumatic. Cardiovascular: Normal rate, regular rhythm. Grossly normal heart sounds.  Good peripheral circulation. Respiratory: Normal respiratory effort without tachypnea nor retractions. Breath sounds are clear and equal bilaterally. No wheezes, rales, rhonchi. Musculoskeletal:  Steady gait.  Neurologic:  Normal speech and language.  Speech is normal. No gait instability.  Skin:  Skin is warm, dry and intact. No rash noted. Psychiatric: Mood and affect are normal. Speech and behavior are normal. Patient exhibits appropriate insight and judgment    ___________________________________________   LABS (all labs ordered are listed, but only abnormal results are displayed)  Labs Reviewed  NOVEL CORONAVIRUS, NAA (HOSP ORDER, SEND-OUT TO REF LAB; TAT 18-24 HRS)     PROCEDURES Procedures   INITIAL IMPRESSION / ASSESSMENT AND PLAN / ED COURSE  Pertinent labs & imaging results that were available during my care of the patient were reviewed by me and considered in my medical decision making (see chart for details).  Well-appearing patient.  Denies symptoms.  COVID-19 testing completed and advice given.  Monitor.  Work note given.  Discussed follow up and return parameters including no resolution or any worsening concerns. Patient verbalized understanding and agreed to plan.   ____________________________________________   FINAL CLINICAL IMPRESSION(S) / ED DIAGNOSES  Final diagnoses:  Exposure to COVID-19 virus     ED Discharge Orders    None       Note: This dictation was prepared with Dragon dictation along with smaller phrase technology. Any transcriptional errors that result from this process are unintentional.         Renford Dills, NP 12/11/18 1130

## 2018-12-11 NOTE — ED Triage Notes (Signed)
PAaient states that she was around her cousin on Thanksgiving day who tested positive for COVID the next day.  Patient denies any symptoms other than a headache.

## 2018-12-13 LAB — NOVEL CORONAVIRUS, NAA (HOSP ORDER, SEND-OUT TO REF LAB; TAT 18-24 HRS): SARS-CoV-2, NAA: NOT DETECTED

## 2019-01-13 ENCOUNTER — Encounter: Payer: Self-pay | Admitting: Emergency Medicine

## 2019-01-13 ENCOUNTER — Other Ambulatory Visit: Payer: Self-pay

## 2019-01-13 ENCOUNTER — Ambulatory Visit
Admission: EM | Admit: 2019-01-13 | Discharge: 2019-01-13 | Disposition: A | Discharge status: Home / Self Care | Payer: BC Managed Care – PPO | Attending: Family Medicine | Admitting: Family Medicine

## 2019-01-13 DIAGNOSIS — Z20822 Contact with and (suspected) exposure to covid-19: Secondary | ICD-10-CM | POA: Diagnosis present

## 2019-01-13 DIAGNOSIS — Z87891 Personal history of nicotine dependence: Secondary | ICD-10-CM

## 2019-01-13 NOTE — Discharge Instructions (Signed)
Results available in 24 to 48 hours.  Stay home.  Take care  Dr. Timya Trimmer   

## 2019-01-13 NOTE — ED Provider Notes (Signed)
[facility]    CSN: [id number] Arrival date & time: 01/13/19  1312  History   Chief Complaint Chief Complaint  Patient presents with  . +covid exposure    HPI   39 year old female presents with Covid exposure.  Patient reports that she was exposed to Covid earlier this week.  She states that she was around a friend who tested positive.  She is currently asymptomatic and feeling well.  She desires testing and needs a work note today.  No other associated symptoms.  No other complaints.  PMH, Surgical Hx, Family Hx, Social History reviewed and updated as below.  Past Medical History:  Diagnosis Date  . EP (epilepsy) [facility])    Past Surgical History:  Procedure Laterality Date  . ANKLE SURGERY    . CHOLECYSTECTOMY      OB History   No obstetric history on file.    Home Medications    Prior to Admission medications   Medication Sig Start Date End Date Taking? Authorizing Provider  escitalopram (LEXAPRO) 20 MG tablet Take 20 mg by mouth daily.   Yes [provider]  etonogestrel-ethinyl estradiol (NUVARING) 0.12-0.015 MG/24HR vaginal ring Place 1 each vaginally every 28 (twenty-eight) days. Insert vaginally and leave in place for 3 consecutive weeks, then remove for 1 week.   Yes [provider]  lamoTRIgine (LAMICTAL) 200 MG tablet Take 200 mg by mouth 2 (two) times daily.   Yes [provider]  levETIRAcetam (KEPPRA) 1000 MG tablet Take 1,500 mg by mouth 2 (two) times daily.   Yes [provider]  pantoprazole (PROTONIX) 40 MG tablet Take 1 tablet (40 mg total) by mouth daily. 07/13/17 01/13/19  Tommie Sams, DO    Family History Family History  Problem Relation Age of Onset  . Cancer Mother   . Breast cancer Mother 92       x3  . Heart attack Father   . Breast cancer Paternal Aunt 40  . Breast cancer Paternal Aunt        2 pat great aunts  . Breast cancer Maternal Aunt 29    Social History Social History   Tobacco Use   . Smoking status: Former Smoker    Quit date: 2012    Years since quitting: 9.0  . Smokeless tobacco: Never Used  Substance Use Topics  . Alcohol use: Yes    Comment: social  . Drug use: No     Allergies   Patient has no known allergies.   Review of Systems Review of Systems  Constitutional: Negative.   HENT: Negative.   Respiratory: Negative.    Physical Exam Triage Vital Signs ED Triage Vitals  Enc Vitals Group     BP 01/13/19 1320 126/80     Pulse Rate 01/13/19 1320 83     Resp 01/13/19 1320 16     Temp 01/13/19 1320 98.1 F (36.7 C)     Temp Source 01/13/19 1320 Oral     SpO2 01/13/19 1320 100 %     Weight 01/13/19 1319 224 lb (101.6 kg)     Height 01/13/19 1319 5\' 7"  (1.702 m)     Head Circumference --      Peak Flow --      Pain Score 01/13/19 1319 0     Pain Loc --      Pain Edu? --      Excl. in GC? --    Updated Vital Signs BP 126/80 (BP Location: Left  Arm)   Pulse 83   Temp 98.1 F (36.7 C) (Oral)   Resp 16   Ht 5\' 7"  (1.702 m)   Wt 101.6 kg   LMP 12/30/2018 (Approximate)   SpO2 100%   BMI 35.08 kg/m   Visual Acuity Right Eye Distance:   Left Eye Distance:   Bilateral Distance:    Right Eye Near:   Left Eye Near:    Bilateral Near:     Physical Exam Vitals and nursing note reviewed.  Constitutional:      General: She is not in acute distress.    Appearance: Normal appearance. She is not ill-appearing.  HENT:     Head: Normocephalic and atraumatic.  Eyes:     General:        Right eye: No discharge.        Left eye: No discharge.     Conjunctiva/sclera: Conjunctivae normal.  Cardiovascular:     Rate and Rhythm: Normal rate and regular rhythm.     Heart sounds: No murmur.  Pulmonary:     Effort: Pulmonary effort is normal.     Breath sounds: Normal breath sounds. No wheezing, rhonchi or rales.  Skin:    General: Skin is warm.     Findings: No rash.  Neurological:     Mental Status: She is alert.  Psychiatric:        Mood  and Affect: Mood normal.        Behavior: Behavior normal.    UC Treatments / Results  Labs (all labs ordered are listed, but only abnormal results are displayed) Labs Reviewed  NOVEL CORONAVIRUS, NAA (HOSP ORDER, SEND-OUT TO REF LAB; TAT 18-24 HRS)    EKG   Radiology No results found.  Procedures Procedures (including critical care time)  Medications Ordered in UC Medications - No data to display  Initial Impression / Assessment and Plan / UC Course  I have reviewed the triage vital signs and the nursing notes.  Pertinent labs & imaging results that were available during my care of the patient were reviewed by me and considered in my medical decision making (see chart for details).    39 year old female presents with Covid exposure.  Asymptomatic.  Awaiting test results.  Final Clinical Impressions(s) / UC Diagnoses   Final diagnoses:  Encounter for screening laboratory testing for COVID-19 virus in asymptomatic patient     Discharge Instructions     Results available in 24 to 48 hours.  Stay home.  Take care  Dr. Lacinda Axon     ED Prescriptions    None     PDMP not reviewed this encounter.   Coral Spikes, [place] 01/13/19 1509

## 2019-01-13 NOTE — ED Triage Notes (Signed)
Patient in today for covid testing after having a +covid exposure either Wed. Or Thur this week. Patient denies any symptoms.

## 2019-01-15 LAB — NOVEL CORONAVIRUS, NAA (HOSP ORDER, SEND-OUT TO REF LAB; TAT 18-24 HRS): SARS-CoV-2, NAA: NOT DETECTED

## 2019-02-14 ENCOUNTER — Other Ambulatory Visit: Payer: Self-pay

## 2019-02-14 ENCOUNTER — Ambulatory Visit
Admission: EM | Admit: 2019-02-14 | Discharge: 2019-02-14 | Disposition: A | Discharge status: Home / Self Care | Payer: BC Managed Care – PPO | Attending: Urgent Care | Admitting: Urgent Care

## 2019-02-14 ENCOUNTER — Encounter: Payer: Self-pay | Admitting: Emergency Medicine

## 2019-02-14 DIAGNOSIS — R197 Diarrhea, unspecified: Secondary | ICD-10-CM

## 2019-02-14 DIAGNOSIS — R5383 Other fatigue: Secondary | ICD-10-CM | POA: Diagnosis not present

## 2019-02-14 DIAGNOSIS — Z7189 Other specified counseling: Secondary | ICD-10-CM | POA: Diagnosis present

## 2019-02-14 DIAGNOSIS — R519 Headache, unspecified: Secondary | ICD-10-CM | POA: Diagnosis not present

## 2019-02-14 DIAGNOSIS — Z20822 Contact with and (suspected) exposure to covid-19: Secondary | ICD-10-CM | POA: Insufficient documentation

## 2019-02-14 NOTE — Discharge Instructions (Addendum)
It was very nice seeing you today in clinic. Thank you for entrusting me with your care.   Rest and Stay HYDRATED. Water and electrolyte containing beverages (Gatorade, Pedialyte) are best to prevent dehydration and electrolyte abnormalities.  May use Tylenol and/or Ibuprofen as needed for pain/fever. May use Imodium if diarrhea becomes severe, however try to limit use or avoid if possible to allow for shedding of the virus.   You were tested for SARS-CoV-2 (novel coronavirus) today. Testing is performed by an outside lab ([facility]) and has variable turn around times ranging between 24-48 hours. Current recommendations from the the CDC and [place] DHHS require that you remain out of work in order to quarantine at home until negative test results are have been received. In the event that your test results are positive, you will be contacted with further directives. These measures are being implemented out of an abundance of caution to prevent transmission and spread during the current SARS-CoV-2 pandemic.  If you develop any worsening symptoms or concerns, make arrangements to follow up with your regular doctor. If your symptoms are severe, please seek follow up care in the ER. Please remember, our [facility] providers are "right here with you" when you need [place].   Again, it was my pleasure to take care of you today. Thank you for choosing our clinic. I hope that you start to feel better quickly.   Quentin Mulling, MSN, APRN, FNP-C, CEN Advanced Practice Provider [facility]

## 2019-02-14 NOTE — ED Triage Notes (Signed)
Patient c/o headache and diarrhea that started 4 days ago. Patient was exposed to COVID on Tuesday 1 week ago.

## 2019-02-14 NOTE — ED Provider Notes (Signed)
[place]   Name: Elizabeth Pollard DOB: 05-17-1980 MRN: [id number] CSN: [id number] PCP: [facility]  Arrival date and time:  02/14/19 1131  Chief Complaint:  Diarrhea and Headache   NOTE: Prior to seeing the patient today, I have reviewed the triage nursing documentation and vital signs. Clinical staff has updated patient's PMH/PSHx, current medication list, and drug allergies/intolerances to ensure comprehensive history available to assist in medical decision making.   History:   HPI: Elizabeth Pollard is a 39 y.o. female who presents today with complaints of fatigue, a generalized headache and diarrhea that started approximately 4 days ago. Patient denies fevers. She denies any cough, shortness of breath, or wheezing. Diarrhea has been 2-3 episodes a day; no noted mucous or blood.  She denies that she has experienced any nausea, vomiting, or abdominal pain. She is eating and drinking well. Patient denies any perceived alterations to her sense of taste or smell. Patient presents out of concerns for her personal health after being exposed to someone who tested positive for SARS-CoV-2 (novel coronavirus) on approximately 01/26-01/27. Patient works at [employer] and has been exposed to multiple co-workers who have tested positive for the virus. No one else is her home has experienced a similar symptom constellation. She has not been tested for SARS-CoV-2 (novel coronavirus) in the past 14 days; last tested negative on 01/13/2019 per her report. She has never been tested for SARS-CoV-2 (novel coronavirus) in the past per her report. Patient has been vaccinated for influenza this season. In efforts to conservatively manage her symptoms at home, the patient notes that she has used IBU, which has not really helped to improve her symptoms.    Past Medical History:  Diagnosis Date  . EP (epilepsy) [facility])     Past Surgical History:  Procedure Laterality Date  . ANKLE SURGERY      . CHOLECYSTECTOMY      Family History  Problem Relation Age of Onset  . Cancer Mother   . Breast cancer Mother 69       x3  . Heart attack Father   . Breast cancer Paternal Aunt 40  . Breast cancer Paternal Aunt        2 pat great aunts  . Breast cancer Maternal Aunt 5    Social History   Tobacco Use  . Smoking status: Former Smoker    Quit date: 2012    Years since quitting: 9.0  . Smokeless tobacco: Never Used  Substance Use Topics  . Alcohol use: Yes    Comment: social  . Drug use: No    There are no problems to display for this patient.   Home Medications:    Current Meds  Medication Sig  . escitalopram (LEXAPRO) 20 MG tablet Take 20 mg by mouth daily.  Marland Kitchen lamoTRIgine (LAMICTAL) 200 MG tablet Take 200 mg by mouth 2 (two) times daily.  Marland Kitchen levETIRAcetam (KEPPRA) 1000 MG tablet Take 1,500 mg by mouth 2 (two) times daily.    Allergies:   Patient has no known allergies.  Review of Systems (ROS): Review of Systems  Constitutional: Positive for fatigue. Negative for fever.  HENT: Negative for congestion, ear pain, postnasal drip, rhinorrhea, sinus pressure, sinus pain, sneezing and sore throat.   Eyes: Negative for pain, discharge and redness.  Respiratory: Negative for cough, chest tightness and shortness of breath.   Cardiovascular: Negative for chest pain and palpitations.  Gastrointestinal: Positive for diarrhea. Negative for abdominal pain, nausea  and vomiting.  Musculoskeletal: Negative for arthralgias, back pain, myalgias and neck pain.  Skin: Negative for color change, pallor and rash.  Neurological: Positive for headaches. Negative for dizziness, syncope and weakness.  Hematological: Negative for adenopathy.     Vital Signs: Today's Vitals   02/14/19 1206 02/14/19 1207  BP:  116/85  Pulse:  75  Resp:  18  Temp:  98.3 F (36.8 C)  TempSrc:  Oral  SpO2:  98%  Weight: 215 lb (97.5 kg)   Height: 5\' 7"  (1.702 m)   PainSc: 7      Physical  Exam: Physical Exam  Constitutional: She is oriented to person, place, and time and well-developed, well-nourished, and in no distress.  HENT:  Head: Normocephalic and atraumatic.  Nose: Nose normal.  Mouth/Throat: Uvula is midline, oropharynx is clear and moist and mucous membranes are normal.  Eyes: Pupils are equal, round, and reactive to light.  Cardiovascular: Normal rate, regular rhythm, normal heart sounds and intact distal pulses.  Pulmonary/Chest: Effort normal and breath sounds normal.  Neurological: She is alert and oriented to person, place, and time. Gait normal.  Skin: Skin is warm and dry. No rash noted. She is not diaphoretic.  Psychiatric: Mood, memory, affect and judgment normal.  Nursing note and vitals reviewed.   Urgent Care Treatments / Results:   Orders Placed This Encounter  Procedures  . Novel Coronavirus, NAA (Hosp order, Send-out to Ref Lab; TAT 18-24 hrs    LABS: PLEASE NOTE: all labs that were ordered this encounter are listed, however only abnormal results are displayed. Labs Reviewed  NOVEL CORONAVIRUS, NAA (HOSP ORDER, SEND-OUT TO REF LAB; TAT 18-24 HRS)    EKG: -None  RADIOLOGY: No results found.  PROCEDURES: Procedures  MEDICATIONS RECEIVED THIS VISIT: Medications - No data to display  PERTINENT CLINICAL COURSE NOTES/UPDATES:   Initial Impression / Assessment and Plan / Urgent Care Course:  Pertinent labs & imaging results that were available during my care of the patient were personally reviewed by me and considered in my medical decision making (see lab/imaging section of note for values and interpretations).  Elizabeth Pollard is a 39 y.o. female who presents to [facility] today with complaints of Diarrhea and Headache  Patient overall well appearing and in no acute distress today in clinic. Presenting symptoms (see HPI) and exam as documented above. She presents with symptoms associated with SARS-CoV-2 (novel coronavirus)  following a directed exposure last week. Discussed typical symptom constellation. Reviewed potential for infection and need for testing. Patient amenable to being tested. SARS-CoV-2 swab collected by certified clinical staff. Discussed variable turn around times associated with testing, as swabs are being processed at [facility], and have been taking between 24-48 hours to come back. She was advised to self quarantine, per [facility] DHHS guidelines, until negative results received. These measures are being implemented out of an abundance of caution to prevent transmission and spread during the current SARS-CoV-2 pandemic.   Presenting symptoms consistent with acute viral illness, with high suspicion of it being SARS-CoV-2 based on her reported symptoms. I discussed with her that her symptoms are felt to be viral in nature, thus antibiotics would not offer her any relief or improve his symptoms any faster than conservative symptomatic management. Discussed supportive care measures at home during acute phase of illness. Patient to rest as much as possible. She was encouraged to ensure adequate hydration (water and ORS) to prevent dehydration and electrolyte derangements. Patient may use APAP and/or  IBU on an as needed basis for pain/fever.    Current clinical condition warrants patient being out of work in order to quarantine while waiting for testing results. She was provided with the appropriate documentation to provide to her place of employment that will allow for her to RTW on 02/16/2019 with no restrictions. RTW is contingent on her SARS-CoV-2 test results being reviewed as negative.     Discussed follow up with primary care physician in 1 week for re-evaluation. I have reviewed the follow up and strict return precautions for any new or worsening symptoms. Patient is aware of symptoms that would be deemed urgent/emergent, and would thus require further evaluation either here or in the emergency department. At the  time of discharge, she verbalized understanding and consent with the discharge plan as it was reviewed with her. All questions were fielded by provider and/or clinic staff prior to patient discharge.    Final Clinical Impressions / Urgent Care Diagnoses:   Final diagnoses:  Suspected COVID-19 virus infection  Exposure to COVID-19 virus  Encounter for laboratory testing for COVID-19 virus  Advice given about COVID-19 virus infection    New Prescriptions:  [place] Controlled Substance Registry consulted? Not Applicable  No orders of the defined types were placed in this encounter.   Recommended Follow up Care:  Patient encouraged to follow up with the following provider within the specified time frame, or sooner as dictated by the severity of her symptoms. As always, she was instructed that for any urgent/emergent care needs, she should seek care either here or in the emergency department for more immediate evaluation.  Follow-up Information    [place] In 1 week.   Contact information: [address] [place] [address] [phone]         NOTE: This note was prepared using Dragon dictation software along with smaller phrase technology. Despite my best ability to proofread, there is the potential that transcriptional errors may still occur from this process, and are completely unintentional.     Verlee Monte, NP 02/14/19 1241

## 2019-02-15 LAB — NOVEL CORONAVIRUS, NAA (HOSP ORDER, SEND-OUT TO REF LAB; TAT 18-24 HRS): SARS-CoV-2, NAA: NOT DETECTED

## 2021-08-13 IMAGING — MG DIGITAL SCREENING BILAT W/ TOMO
8 series · 8 of 24 positions shown · non-contrast
Comparison: Previous exam(s).

CLINICAL DATA: Screening.

EXAM:
DIGITAL SCREENING BILATERAL MAMMOGRAM WITH TOMO AND CAD

[L CC synth-2D]
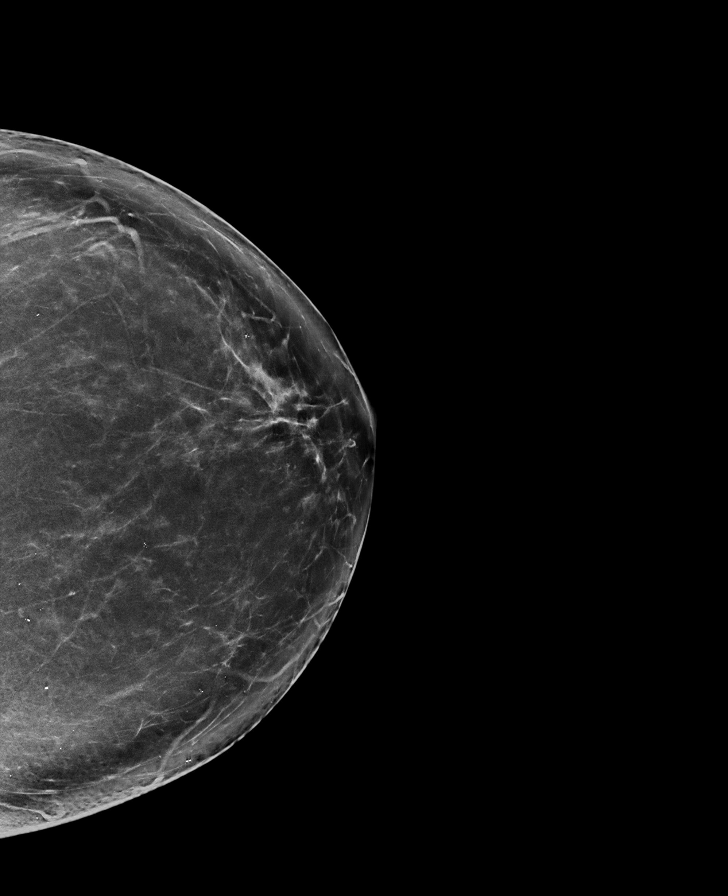

[R MLO synth-2D]
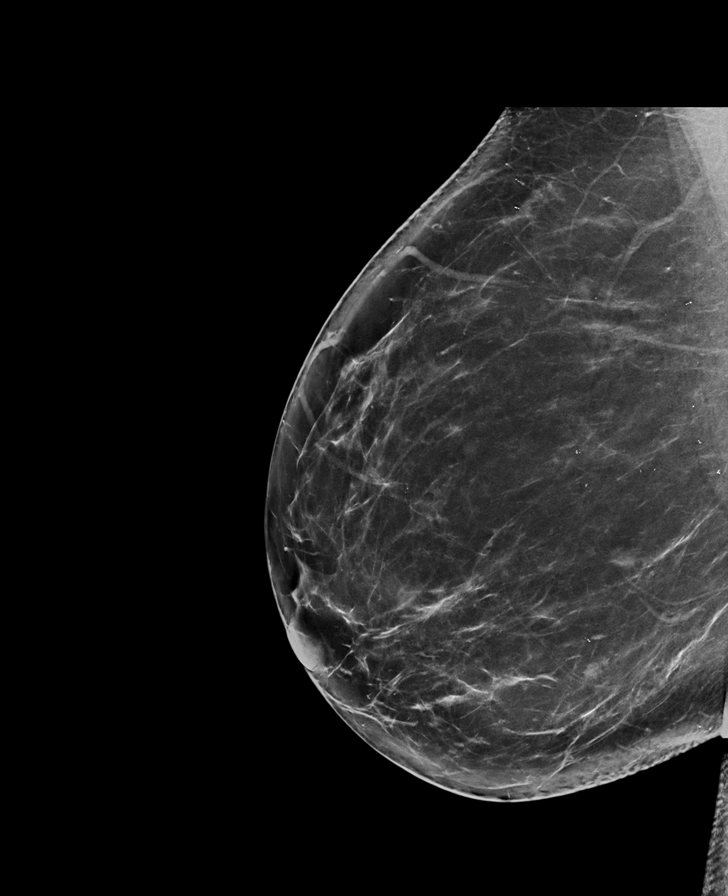

[R CC synth-2D]
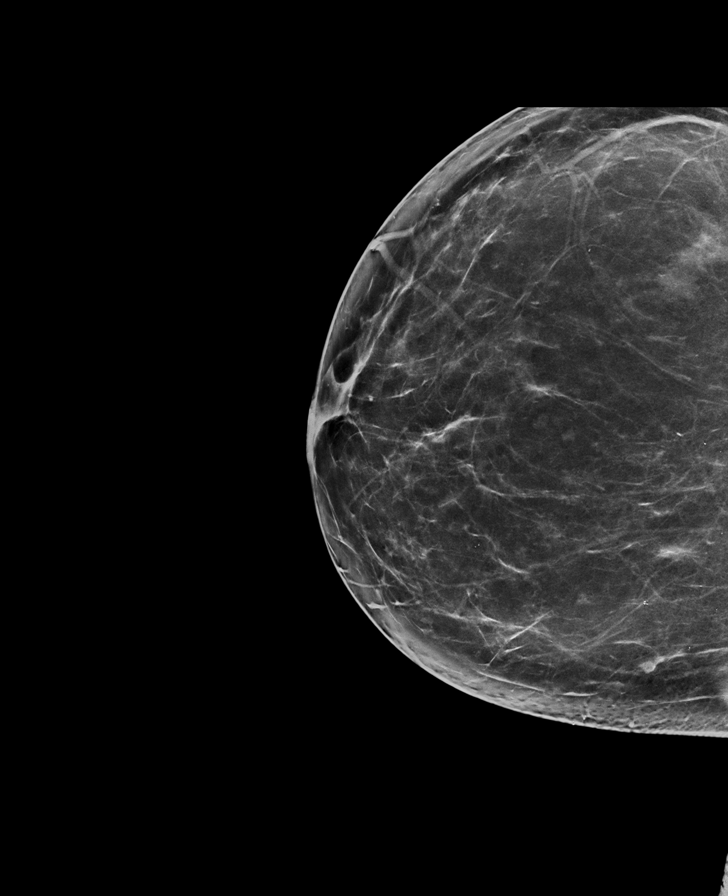

[L MLO synth-2D]
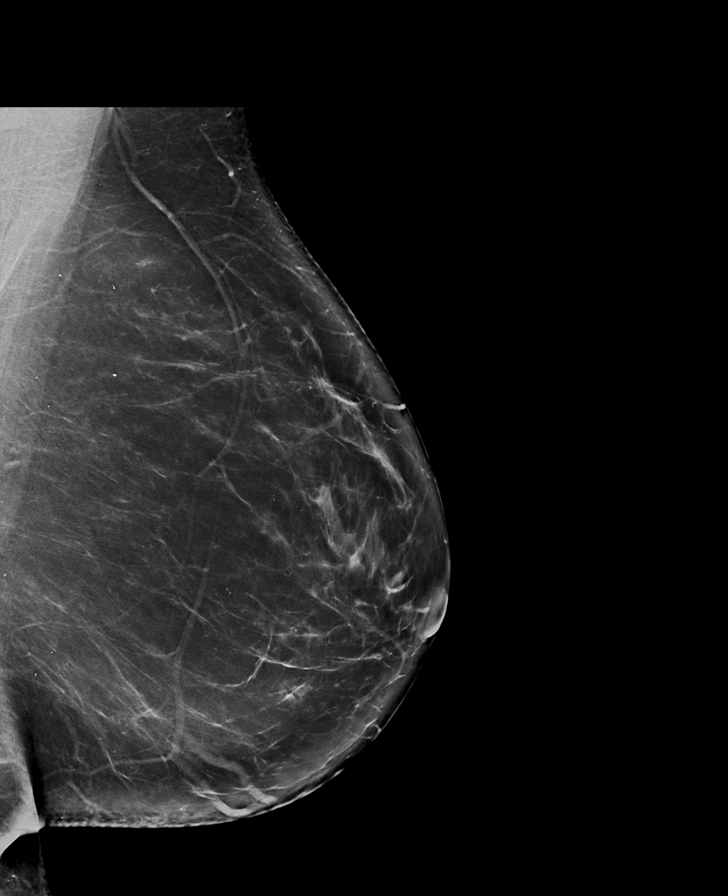

[L CC tomo · tomo slice 44/87.0]
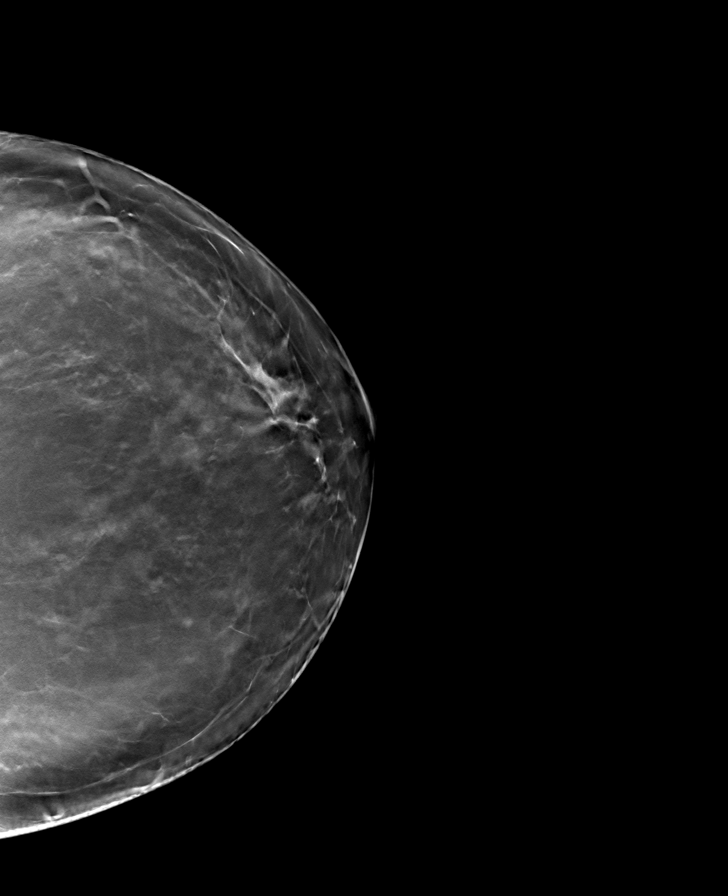

[R CC tomo · tomo slice 42/83.0]
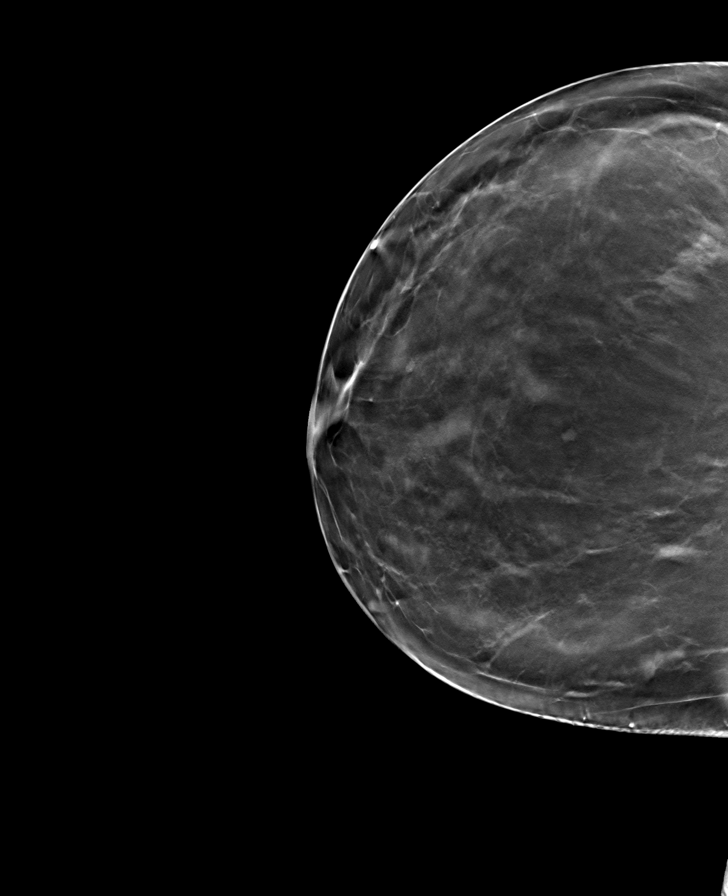

[L MLO tomo · tomo slice 49/97.0]
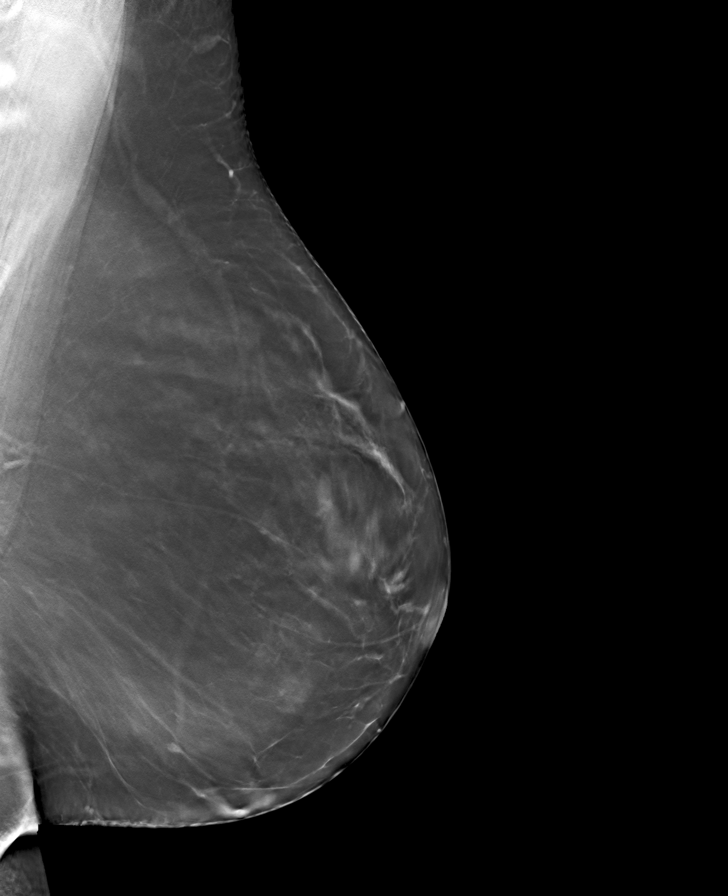

[R MLO tomo · tomo slice 45/89.0]
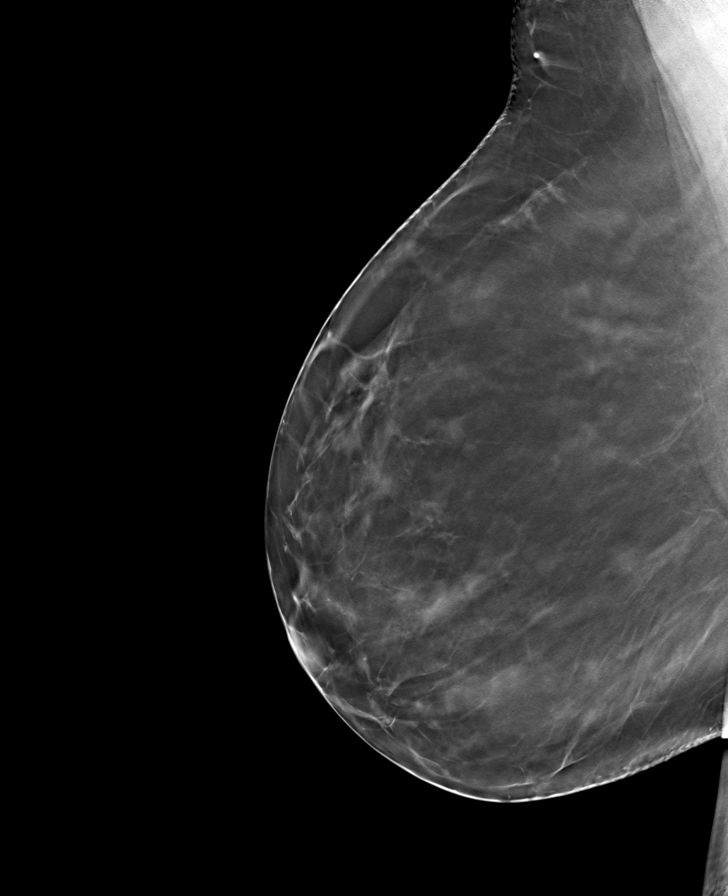

[8 of 24 positions shown; findings below may reference images not displayed]

ACR Breast Density Category b: There are scattered areas of
fibroglandular density.
FINDINGS: There are no findings suspicious for malignancy. Images were
processed with CAD.
IMPRESSION: No mammographic evidence of malignancy. A result letter of this
screening mammogram will be mailed directly to the patient.

RECOMMENDATION:
Screening mammogram in one year. (Code:[id number])

BI-RADS CATEGORY  1: Negative.

## 2022-08-21 ENCOUNTER — Other Ambulatory Visit: Payer: Self-pay

## 2022-08-21 ENCOUNTER — Emergency Department: Payer: Federal, State, Local not specified - PPO

## 2022-08-21 ENCOUNTER — Emergency Department
Admission: EM | Admit: 2022-08-21 | Discharge: 2022-08-21 | Disposition: A | Discharge status: Home / Self Care | Payer: Federal, State, Local not specified - PPO | Source: Home / Self Care | Attending: Emergency Medicine | Admitting: Emergency Medicine

## 2022-08-21 DIAGNOSIS — M533 Sacrococcygeal disorders, not elsewhere classified: Secondary | ICD-10-CM

## 2022-08-21 DIAGNOSIS — W1839XA Other fall on same level, initial encounter: Secondary | ICD-10-CM | POA: Diagnosis not present

## 2022-08-21 DIAGNOSIS — W19XXXA Unspecified fall, initial encounter: Secondary | ICD-10-CM

## 2022-08-21 NOTE — ED Triage Notes (Signed)
Pt to ED for fall Sunday after dog pulled her down. Denies hitting head or LOC. Reports pain to tailbone

## 2022-08-21 NOTE — ED Provider Notes (Signed)
[facility] Provider Note    Event Date/Time   First MD Initiated Contact with Patient 08/21/22 1849     (approximate)   History   Fall   HPI Elizabeth Pollard is a 42 y.o. female who presents today for tailbone injury.  Patient states 1 week ago she was pulled down to the ground by her dog and landed on her tailbone.  She has had pain with sitting on that tailbone for the past week.  She presumed it would get better but pain was still continuing and she came to the ED for further evaluation.  No numbness or weakness down her lower extremities.  No pain elsewhere.  Still ambulating okay.     Physical Exam   Triage Vital Signs: ED Triage Vitals [08/21/22 1547]  Encounter Vitals Group     BP 113/84     Systolic BP Percentile      Diastolic BP Percentile      Pulse Rate 79     Resp 18     Temp 98.7 F (37.1 C)     Temp src      SpO2 96 %     Weight 210 lb (95.3 kg)     Height 5\' 7"  (1.702 m)     Head Circumference      Peak Flow      Pain Score 8     Pain Loc      Pain Education      Exclude from Growth Chart     Most recent vital signs: Vitals:   08/21/22 1547  BP: 113/84  Pulse: 79  Resp: 18  Temp: 98.7 F (37.1 C)  SpO2: 96%    I have reviewed the vital signs. General:  Awake, alert, no acute distress. Head:  Normocephalic, Atraumatic. EENT:  PERRL, EOMI, Oral mucosa pink and moist, Neck is supple. Cardiovascular: Regular rate, 2+ distal pulses. Respiratory:  Normal respiratory effort, symmetrical expansion, no distress.   Extremities:  Moving all four extremities through full ROM without pain.  Mild tenderness to palpation in the sacral region with no obvious bruising at the site.  No L-spine tenderness to palpation.  No other paraspinal tenderness to palpation. Neuro:  Alert and oriented.  Interacting appropriately.   Skin:  Warm, dry, no rash.   Psych: Appropriate affect.   ED Results / Procedures / Treatments   Labs (all  labs ordered are listed, but only abnormal results are displayed) Labs Reviewed - No data to display   EKG    RADIOLOGY I viewed and independently interpreted the x-rays of the pelvis and sacrum with no obvious signs of fracture or dislocation present.   PROCEDURES:  Critical Care performed: No  Procedures   MEDICATIONS ORDERED IN ED: Medications - No data to display   IMPRESSION / MDM / ASSESSMENT AND PLAN / ED COURSE  I reviewed the triage vital signs and the nursing notes.                              Differential diagnosis includes, but is not limited to, sacral bone fracture, musculoskeletal hematoma, sacral spine ligamentous injury.  Patient's presentation is most consistent with acute, uncomplicated illness.  Patient is a 42 year old female presenting today with tailbone pain following fall.  Still ambulating without issue.  X-rays reveal no evidence of acute fracture.  Suggestive of likely soft tissue injury without actual fracture.  Recommended  NSAIDs and Tylenol along with cushioned doughnut when she is seated.  Patient satisfied with this plan will follow-up with her primary care provider as needed.  She was given strict return precautions.      FINAL CLINICAL IMPRESSION(S) / ED DIAGNOSES   Final diagnoses:  Tail bone pain  Fall, initial encounter     Rx / DC Orders   ED Discharge Orders     None        Note:  This document was prepared using Dragon voice recognition software and may include unintentional dictation errors.   Janith Lima, MD 08/21/22 [id number]

## 2022-08-21 NOTE — Discharge Instructions (Signed)
You are seen in the emergency department today for your tailbone pain.  No evidence of fracture seen on x-rays.  Please take ibuprofen 600 mg every 8 hours for the next 5 days and Tylenol 650 mg every 4-6 hours for the next 3 days and then take both as needed.  Please buy a cushion doughnut to use to help relieve the pain at this site.  Please follow-up with your PCP in 1 week for reassessment as needed.

## 2023-05-12 ENCOUNTER — Other Ambulatory Visit: Payer: Self-pay | Admitting: Family Medicine

## 2023-05-12 DIAGNOSIS — Z1231 Encounter for screening mammogram for malignant neoplasm of breast: Secondary | ICD-10-CM

## 2023-05-25 ENCOUNTER — Encounter

## 2023-08-05 ENCOUNTER — Other Ambulatory Visit: Payer: Self-pay | Admitting: Family Medicine

## 2023-08-05 DIAGNOSIS — N63 Unspecified lump in unspecified breast: Secondary | ICD-10-CM

## 2023-08-10 ENCOUNTER — Encounter

## 2023-08-10 ENCOUNTER — Other Ambulatory Visit
